# Patient Record
Sex: Female | Born: 1945 | Race: White | Hispanic: No | Marital: Married | Smoking: Former smoker
Health system: Southern US, Community
[De-identification: ages and names within clinical notes are randomized; demographics above are authoritative.]

## PROBLEM LIST (undated history)

## (undated) DIAGNOSIS — K529 Noninfective gastroenteritis and colitis, unspecified: Secondary | ICD-10-CM

## (undated) DIAGNOSIS — I10 Essential (primary) hypertension: Secondary | ICD-10-CM

## (undated) DIAGNOSIS — E079 Disorder of thyroid, unspecified: Secondary | ICD-10-CM

## (undated) DIAGNOSIS — E7800 Pure hypercholesterolemia, unspecified: Secondary | ICD-10-CM

---

## 2013-11-07 ENCOUNTER — Ambulatory Visit: Payer: Self-pay | Admitting: Physician Assistant

## 2013-11-07 LAB — BASIC METABOLIC PANEL
Anion Gap: 10 (ref 7–16)
BUN: 15 mg/dL (ref 7–18)
Calcium, Total: 9 mg/dL (ref 8.5–10.1)
Chloride: 105 mmol/L (ref 98–107)
Co2: 27 mmol/L (ref 21–32)
Creatinine: 0.82 mg/dL (ref 0.60–1.30)
EGFR (African American): >60
Glucose: 116 mg/dL — ABNORMAL HIGH (ref 65–99)
Potassium: 4.7 mmol/L (ref 3.5–5.1)
Sodium: 142 mmol/L (ref 136–145)

## 2013-11-07 LAB — CBC WITH DIFFERENTIAL/PLATELET
Basophil #: 0 10*3/uL (ref 0.0–0.1)
Eosinophil #: 0.3 10*3/uL (ref 0.0–0.7)
Eosinophil %: 2.3 %
HCT: 43.7 % (ref 35.0–47.0)
HGB: 14.4 g/dL (ref 12.0–16.0)
Lymphocyte #: 2 10*3/uL (ref 1.0–3.6)
MCH: 31 pg (ref 26.0–34.0)
MCHC: 33.1 g/dL (ref 32.0–36.0)
MCV: 94 fL (ref 80–100)
Monocyte #: 0.6 x10 3/mm (ref 0.2–0.9)
Monocyte %: 5.3 %
Platelet: 230 10*3/uL (ref 150–440)
RBC: 4.66 10*6/uL (ref 3.80–5.20)
RDW: 13.3 % (ref 11.5–14.5)
WBC: 12.3 10*3/uL — ABNORMAL HIGH (ref 3.6–11.0)

## 2013-11-07 LAB — CLOSTRIDIUM DIFFICILE(ARMC)

## 2013-11-10 LAB — STOOL CULTURE

## 2013-12-04 ENCOUNTER — Ambulatory Visit: Payer: Self-pay | Admitting: Physician Assistant

## 2013-12-11 ENCOUNTER — Ambulatory Visit: Payer: Self-pay | Admitting: Physician Assistant

## 2013-12-13 ENCOUNTER — Ambulatory Visit: Payer: Self-pay | Admitting: Physician Assistant

## 2013-12-15 ENCOUNTER — Ambulatory Visit: Payer: Self-pay | Admitting: Family Medicine

## 2017-10-08 ENCOUNTER — Ambulatory Visit: Payer: Medicare Other

## 2017-10-08 ENCOUNTER — Other Ambulatory Visit: Payer: Self-pay

## 2017-10-08 ENCOUNTER — Encounter: Payer: Self-pay | Admitting: *Deleted

## 2017-10-08 ENCOUNTER — Ambulatory Visit
Admission: EM | Admit: 2017-10-08 | Discharge: 2017-10-08 | Disposition: A | Discharge status: Home / Self Care | Payer: Medicare Other | Attending: Family Medicine | Admitting: Family Medicine

## 2017-10-08 DIAGNOSIS — W19XXXA Unspecified fall, initial encounter: Secondary | ICD-10-CM

## 2017-10-08 DIAGNOSIS — S62657A Nondisplaced fracture of medial phalanx of left little finger, initial encounter for closed fracture: Secondary | ICD-10-CM

## 2017-10-08 DIAGNOSIS — M79645 Pain in left finger(s): Secondary | ICD-10-CM | POA: Diagnosis present

## 2017-10-08 HISTORY — DX: Disorder of thyroid, unspecified: E07.9

## 2017-10-08 NOTE — Discharge Instructions (Signed)
Ice, Rest and elevate. Wear finger splint and buddy tape.   Follow up with your primary care physician or orthopedic in one week. Return to Urgent care for new or worsening concerns.

## 2017-10-08 NOTE — ED Triage Notes (Signed)
Patient fell up a set of stairs yesterday PM injuring her left pinky finger. No previus history of left hand injuries.

## 2017-10-08 NOTE — ED Provider Notes (Signed)
[facility] ____________________________________________  Time seen: Approximately 2:06 PM  I have reviewed the triage vital signs and the nursing notes.   HISTORY  Chief Complaint Finger Injury   HPI Lisa Pope is a 71 y.o. female presented for evaluation of the left fifth finger pain after injury that occurred yesterday.  Patient reports that she is walking up steps, tripped on the step causing her to fall forward.  States in the process of catching herself she hit her left fifth digit on the railing.  States pain only to the area.  Denies head injury, loss consciousness, other pain or injuries.  States pain is currently mild, worse with direct palpation.  States wanted to evaluate make sure no fracture.  Denies other aggravating or alleviating factors.  Reports otherwise feels well and denies other complaints.    Past Medical History:  Diagnosis Date  . Thyroid disease     There are no active problems to display for this patient.   History reviewed. No pertinent surgical history.   No current facility-administered medications for this encounter.   Current Outpatient Medications:  .  levothyroxine (SYNTHROID, LEVOTHROID) 88 MCG tablet, Take 88 mcg daily before breakfast by mouth., Disp: , Rfl:  .  Multiple Vitamin (MULTIVITAMIN) tablet, Take 1 tablet daily by mouth., Disp: , Rfl:  .  Omega-3 Fatty Acids (FISH OIL PO), Take daily by mouth., Disp: , Rfl:   Allergies Patient has no known allergies.  Family History  Problem Relation Age of Onset  . Cancer Mother   . Heart attack Father     Social History Social History   Tobacco Use  . Smoking status: Former Games developermoker  . Smokeless tobacco: Never Used  Substance Use Topics  . Alcohol use: Yes  . Drug use: No    Review of Systems Constitutional: No fever/chills Cardiovascular: Denies chest pain. Respiratory: Denies shortness of breath. Gastrointestinal: No abdominal pain.   Musculoskeletal:  Negative for back pain.  ____________________________________________   PHYSICAL EXAM:  VITAL SIGNS: ED Triage Vitals  Enc Vitals Group     BP 10/08/17 1340 (!) 160/59     Pulse Rate 10/08/17 1340 66     Resp 10/08/17 1340 16     Temp 10/08/17 1340 98.1 F (36.7 C)     Temp Source 10/08/17 1340 Oral     SpO2 10/08/17 1340 99 %     Weight 10/08/17 1341 129 lb (58.5 kg)     Height 10/08/17 1341 5' (1.524 m)     Head Circumference --      Peak Flow --      Pain Score 10/08/17 1344 5     Pain Loc --      Pain Edu? --      Excl. in GC? --     Constitutional: Alert and oriented. Well appearing and in no acute distress.. Cardiovascular: Normal rate, regular rhythm. Grossly normal heart sounds.  Good peripheral circulation. Respiratory: Normal respiratory effort without tachypnea nor retractions. Breath sounds are clear and equal bilaterally. No wheezes, rales, rhonchi. Musculoskeletal:  Steady gait.  Except: Left hand fifth digit at PIP joint moderate tenderness to direct palpation, mild ecchymosis and swelling present to middle proximal phalanx of fifth digit as well as MCP joint, normal distal sensation and capillary refill, no motor or tendon deficit to the fifth digit, left hand otherwise nontender. Neurologic:  Normal speech and language. Speech is normal. No gait instability.  Skin:  Skin is warm, dry and  intact. No rash noted. Psychiatric: Mood and affect are normal. Speech and behavior are normal. Patient exhibits appropriate insight and judgment   ___________________________________________   LABS (all labs ordered are listed, but only abnormal results are displayed)  Labs Reviewed - No data to display ____________________________________________  RADIOLOGY  Dg Finger Little Left  Result Date: 10/08/2017 CLINICAL DATA:  Proximal interphalangeal joint pain after fall EXAM: LEFT LITTLE FINGER 2+V COMPARISON:  None. FINDINGS: There are oblique intra-articular fractures  at the dorsal and ventral surfaces of the base of the middle phalanx of the left little finger. Moderate soft tissue swelling. IMPRESSION: Oblique intra-articular fractures at the dorsal and ventral surfaces of the left little finger middle phalangeal base. Electronically Signed   By: Deatra RobinsonKevin  Herman M.D.   On: 10/08/2017 14:25   ____________________________________________   PROCEDURES Procedures    INITIAL IMPRESSION / ASSESSMENT AND PLAN / ED COURSE  Pertinent labs & imaging results that were available during my care of the patient were reviewed by me and considered in my medical decision making (see chart for details).  Well-appearing patient.  No acute distress.  Left fifth digit pain and swelling post mechanical injury that occurred yesterday.  Denies other injuries.  Left fifth digit x-ray per radiologist oblique intra-articular fracture of the dorsal and ventral surfaces of the left fifth digit middle proximal phalanx base.  With digit placed in finger splint, fourth and fifth digit buddy taped.  Counseled regarding ice, elevation and supportive care.  Keep in splint.  Follow-up with primary orthopedic in 1 week, information given.  Discussed follow up and return parameters including no resolution or any worsening concerns. Patient verbalized understanding and agreed to plan.   ____________________________________________   FINAL CLINICAL IMPRESSION(S) / ED DIAGNOSES  Final diagnoses:  Nondisplaced fracture of middle phalanx of left little finger, initial encounter for closed fracture     ED Discharge Orders    None       Note: This dictation was prepared with Dragon dictation along with smaller phrase technology. Any transcriptional errors that result from this process are unintentional.         Renford DillsMiller, Lenoard Helbert, NP 10/08/17 [phone]

## 2018-07-01 ENCOUNTER — Ambulatory Visit
Admission: EM | Admit: 2018-07-01 | Discharge: 2018-07-01 | Disposition: A | Discharge status: Home / Self Care | Payer: Medicare Other | Attending: Family Medicine | Admitting: Family Medicine

## 2018-07-01 ENCOUNTER — Encounter: Payer: Self-pay | Admitting: Emergency Medicine

## 2018-07-01 ENCOUNTER — Other Ambulatory Visit: Payer: Self-pay

## 2018-07-01 ENCOUNTER — Ambulatory Visit: Payer: Medicare Other

## 2018-07-01 DIAGNOSIS — W19XXXA Unspecified fall, initial encounter: Secondary | ICD-10-CM | POA: Insufficient documentation

## 2018-07-01 DIAGNOSIS — S6992XA Unspecified injury of left wrist, hand and finger(s), initial encounter: Secondary | ICD-10-CM | POA: Diagnosis present

## 2018-07-01 DIAGNOSIS — S62317A Displaced fracture of base of fifth metacarpal bone. left hand, initial encounter for closed fracture: Secondary | ICD-10-CM | POA: Insufficient documentation

## 2018-07-01 DIAGNOSIS — Y92009 Unspecified place in unspecified non-institutional (private) residence as the place of occurrence of the external cause: Secondary | ICD-10-CM | POA: Insufficient documentation

## 2018-07-01 DIAGNOSIS — E079 Disorder of thyroid, unspecified: Secondary | ICD-10-CM | POA: Diagnosis not present

## 2018-07-01 DIAGNOSIS — Z79890 Hormone replacement therapy (postmenopausal): Secondary | ICD-10-CM | POA: Diagnosis not present

## 2018-07-01 DIAGNOSIS — Z79899 Other long term (current) drug therapy: Secondary | ICD-10-CM | POA: Diagnosis not present

## 2018-07-01 DIAGNOSIS — W108XXA Fall (on) (from) other stairs and steps, initial encounter: Secondary | ICD-10-CM | POA: Diagnosis not present

## 2018-07-01 DIAGNOSIS — Z87891 Personal history of nicotine dependence: Secondary | ICD-10-CM | POA: Insufficient documentation

## 2018-07-01 NOTE — Discharge Instructions (Signed)
Follow up with hand orthopedic specialist this week

## 2018-07-01 NOTE — ED Provider Notes (Signed)
[facility]    CSN: [id number] Arrival date & time: 07/01/18  0850     History   Chief Complaint Chief Complaint  Patient presents with  . Hand Injury    HPI Lisa Pope is a 72 y.o. female.   72 yo female with a c/o left hand pain since injuring it 6 days ago. States she fell at home and since then has had pain and swelling to the hand. Has been icing it.   The history is provided by the patient.  Hand Injury    Past Medical History:  Diagnosis Date  . Thyroid disease     There are no active problems to display for this patient.   History reviewed. No pertinent surgical history.  OB History   None      Home Medications    Prior to Admission medications   Medication Sig Start Date End Date Taking? Authorizing Provider  levothyroxine (SYNTHROID, LEVOTHROID) 88 MCG tablet Take 88 mcg daily before breakfast by mouth.   Yes [provider]  Multiple Vitamin (MULTIVITAMIN) tablet Take 1 tablet daily by mouth.   Yes [provider]  Omega-3 Fatty Acids (FISH OIL PO) Take daily by mouth.   Yes [provider]    Family History Family History  Problem Relation Age of Onset  . Cancer Mother   . Heart attack Father     Social History Social History   Tobacco Use  . Smoking status: Former Games developermoker  . Smokeless tobacco: Never Used  Substance Use Topics  . Alcohol use: Yes  . Drug use: No     Allergies   Patient has no known allergies.   Review of Systems Review of Systems   Physical Exam Triage Vital Signs ED Triage Vitals  Enc Vitals Group     BP 07/01/18 0905 135/64     Pulse Rate 07/01/18 0905 65     Resp 07/01/18 0905 16     Temp 07/01/18 0905 98.2 F (36.8 C)     Temp Source 07/01/18 0905 Oral     SpO2 07/01/18 0905 98 %     Weight 07/01/18 0903 124 lb (56.2 kg)     Height 07/01/18 0903 5' (1.524 m)     Head Circumference --      Peak Flow --      Pain Score 07/01/18 0903 7     Pain Loc --       Pain Edu? --      Excl. in GC? --    No data found.  Updated Vital Signs BP 135/64 (BP Location: Right Arm)   Pulse 65   Temp 98.2 F (36.8 C) (Oral)   Resp 16   Ht 5' (1.524 m)   Wt 124 lb (56.2 kg)   SpO2 98%   BMI 24.22 kg/m   Visual Acuity Right Eye Distance:   Left Eye Distance:   Bilateral Distance:    Right Eye Near:   Left Eye Near:    Bilateral Near:     Physical Exam  Constitutional: She appears well-developed and well-nourished. No distress.  Musculoskeletal:       Left hand: She exhibits decreased range of motion, tenderness, bony tenderness and swelling. She exhibits normal two-point discrimination, normal capillary refill, no deformity and no laceration. Normal sensation noted. Normal strength noted.  Left hand neurovascularly intact  Skin: She is not diaphoretic.  Nursing note and vitals reviewed.    UC Treatments /  Results  Labs (all labs ordered are listed, but only abnormal results are displayed) Labs Reviewed - No data to display  EKG None  Radiology Dg Hand Complete Left  Result Date: 07/01/2018 CLINICAL DATA:  Fall walking up stairs 5 days ago with left hand injury. Initial encounter. EXAM: LEFT HAND - COMPLETE 3+ VIEW COMPARISON:  Fifth digit radiograph 10/08/2017 FINDINGS: Fracture lucency with mild displacement at the base of the fifth metacarpal. This is new from prior but age indeterminate given sclerotic lines. Diffuse interphalangeal osteoarthritis with spurring greatest at the fifth PIP joint. No dislocation. IMPRESSION: 1. Fifth metacarpal base fracture that is new from November 2018 but age indeterminate. Displacement is mild. 2. Advanced interphalangeal osteoarthritis. Electronically Signed   By: Marnee Spring M.D.   On: 07/01/2018 09:42    Procedures Procedures (including critical care time)  Medications Ordered in UC Medications - No data to display  Initial Impression / Assessment and Plan / UC Course  I have reviewed  the triage vital signs and the nursing notes.  Pertinent labs & imaging results that were available during my care of the patient were reviewed by me and considered in my medical decision making (see chart for details).      Final Clinical Impressions(s) / UC Diagnoses   Final diagnoses:  Closed displaced fracture of base of fifth metacarpal bone of left hand, initial encounter    ED Prescriptions    None      1. x-ray results and diagnosis reviewed with patient 2. Immobilized left hand with ulnar gutter splint and sling 3. Recommend supportive treatment with otc analgesics prn  4. Follow-up with orthopedist hand specialist this week     Controlled Substance Prescriptions [place] Controlled Substance Registry consulted? Not Applicable   Payton Mccallum, MD 07/01/18 [phone]

## 2018-07-01 NOTE — ED Triage Notes (Signed)
Patient c/o falling up the stairs Friday night and injuring her left hand.

## 2019-01-21 IMAGING — CR DG FINGER LITTLE 2+V*L*
3 series · 3 of 3 positions shown · non-contrast
Comparison: None.

CLINICAL DATA: Proximal interphalangeal joint pain after fall

EXAM:
LEFT LITTLE FINGER 2+V

[finger ap]
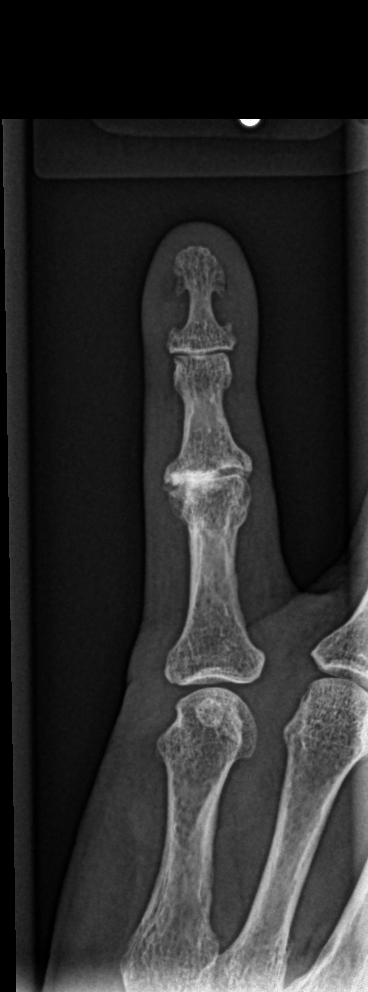

[finger obl]
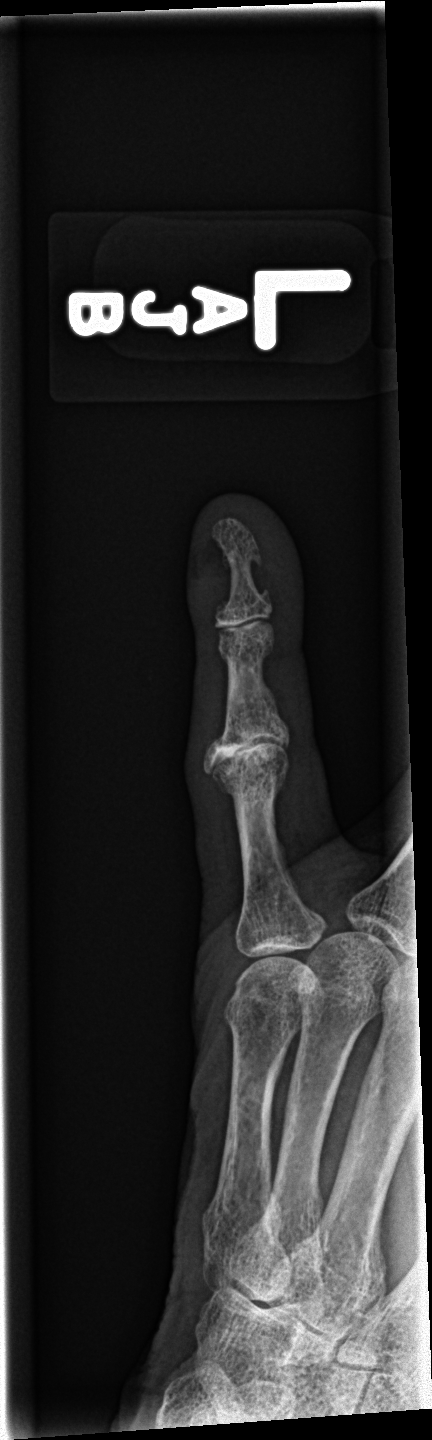

[finger lat]
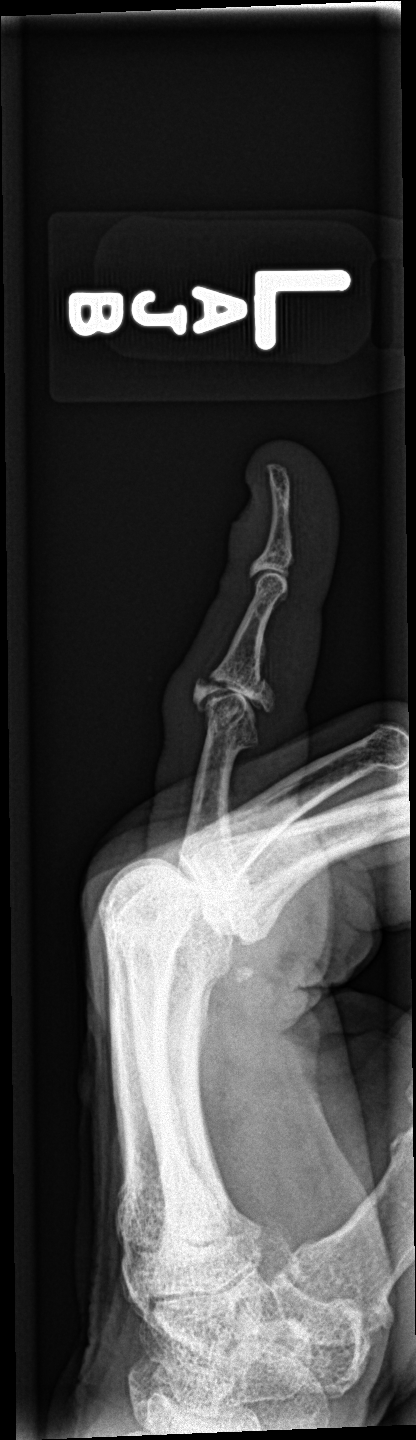

[3 of 3 positions shown; findings below may reference images not displayed]

FINDINGS: There are oblique intra-articular fractures at the dorsal and
ventral surfaces of the base of the middle phalanx of the left
little finger. Moderate soft tissue swelling.
IMPRESSION: Oblique intra-articular fractures at the dorsal and ventral surfaces
of the left little finger middle phalangeal base.

## 2020-08-14 ENCOUNTER — Other Ambulatory Visit: Payer: Self-pay

## 2020-08-14 ENCOUNTER — Ambulatory Visit: Payer: Medicare Other | Admitting: Dermatology

## 2020-08-14 DIAGNOSIS — L80 Vitiligo: Secondary | ICD-10-CM

## 2020-08-14 DIAGNOSIS — B360 Pityriasis versicolor: Secondary | ICD-10-CM

## 2020-08-14 DIAGNOSIS — L820 Inflamed seborrheic keratosis: Secondary | ICD-10-CM | POA: Diagnosis not present

## 2020-08-14 MED ORDER — KETOCONAZOLE 2 % EX CREA: TOPICAL_CREAM | CUTANEOUS | 0 refills | Status: DC

## 2020-08-14 MED ORDER — TACROLIMUS 0.1 % EX OINT: TOPICAL_OINTMENT | CUTANEOUS | 2 refills | Status: DC

## 2020-08-14 NOTE — Patient Instructions (Addendum)
Cryotherapy Aftercare  . Wash gently with soap and water everyday.   Marland Kitchen Apply Vaseline and Band-Aid daily until healed.   Ketoconazole 2% cream - Apply to light spots on chest and neck 1-2 times a day for Tinea Versicolor until improved.  Tacrolimus Ointment - Apply to vitiligo spots 1-2 times daily, ok to use on face.

## 2020-08-14 NOTE — Progress Notes (Signed)
   New Patient Visit  Subjective  Lisa Pope is a 74 y.o. female who presents for the following: Light spots (hands, face). She has had this condition for many years. She has used topicals in the past on her hands which didn't help. New area of lightened skin around her mouth She also has a growth on her neck that is bothersome.   The following portions of the chart were reviewed this encounter and updated as appropriate:      Review of Systems:  No other skin or systemic complaints except as noted in HPI or Assessment and Plan.  Objective  Well appearing patient in no apparent distress; mood and affect are within normal limits.  A focused examination was performed including face, hands. Relevant physical exam findings are noted in the Assessment and Plan.  Objective  Perioral; bil medial canthus; dorsum hands: Depigmented macules and patches on hands, R>L perioral area, medial canthus BL.  Images        Objective  Chest, Neck: Hypopigmented macules and patches.  Objective  Right Clavicle x 1: Erythematous keratotic or waxy stuck-on papule   Assessment & Plan  Vitiligo Perioral; bil medial canthus; dorsum hands  Start tacrolimus 0.1% ointment Apply to AAs qd/bid dsp 60g 2Rf.  Recommend daily broad spectrum sunscreen SPF 30+ to sun-exposed areas, reapply every 2 hours as needed.   Discussed Xtrac laser, but distance is a factor.  Discussed topical treatment not as effective for hands  tacrolimus (PROTOPIC) 0.1 % ointment - Perioral; bil medial canthus; dorsum hands  Tinea versicolor Chest, Neck  Start ketoconazole 2% cream Apply to light spots on chest and neck BID until improved dsp 60g 0Rf.  May recur Rec Zbar in shower during summer as preventative  ketoconazole (NIZORAL) 2 % cream - Chest, Neck  Inflamed seborrheic keratosis Right Clavicle x 1  Destruction of lesion - Right Clavicle x 1  Destruction method: cryotherapy   Informed consent:  discussed and consent obtained   Lesion destroyed using liquid nitrogen: Yes   Region frozen until ice ball extended beyond lesion: Yes   Outcome: patient tolerated procedure well with no complications   Post-procedure details: wound care instructions given    Return if symptoms worsen or fail to improve.   ICherlyn Labella, CMA, am acting as scribe for Willeen Niece, MD .  Documentation: I have reviewed the above documentation for accuracy and completeness, and I agree with the above.  Willeen Niece MD

## 2020-09-13 ENCOUNTER — Other Ambulatory Visit: Payer: Self-pay | Admitting: Dermatology

## 2020-09-13 DIAGNOSIS — B360 Pityriasis versicolor: Secondary | ICD-10-CM

## 2021-01-01 ENCOUNTER — Ambulatory Visit: Payer: Medicare Other | Admitting: Dermatology

## 2021-01-01 ENCOUNTER — Other Ambulatory Visit: Payer: Self-pay

## 2021-01-01 DIAGNOSIS — L80 Vitiligo: Secondary | ICD-10-CM

## 2021-01-01 DIAGNOSIS — B360 Pityriasis versicolor: Secondary | ICD-10-CM

## 2021-01-01 MED ORDER — KETOCONAZOLE 2 % EX CREA: TOPICAL_CREAM | Freq: Every day | CUTANEOUS | 2 refills | Status: AC

## 2021-01-01 MED ORDER — TACROLIMUS 0.1 % EX OINT: TOPICAL_OINTMENT | CUTANEOUS | 2 refills | Status: DC

## 2021-01-01 NOTE — Progress Notes (Signed)
   Follow-Up Visit   Subjective  Lisa Pope is a 75 y.o. female who presents for the following: Vitiligo (Face, Tacrolimus qd) and Tinea versicolor (Chest, neck, ketoconazole 2% cr qd).  Both conditions seem to be better.   The following portions of the chart were reviewed this encounter and updated as appropriate:       Review of Systems:  No other skin or systemic complaints except as noted in HPI or Assessment and Plan.  Objective  Well appearing patient in no apparent distress; mood and affect are within normal limits.  A focused examination was performed including face, neck, chest. Relevant physical exam findings are noted in the Assessment and Plan.  Objective  perioral, bil medial canthus, hands: Repigmented patches perioral/periocular area when compared to photos, hypopigmented patches on hands  Objective  Neck, chest: Clear today   Assessment & Plan  Vitiligo perioral, bil medial canthus, hands  Face Improved compared to photos, pt not currently treating hands- no change  Cont Tacrolimus 0.1% oint qd to aas face Discussed Opzelura cream- may try in future once approved for vitiligo.  Reviewed chronic nature, no cure and can be difficult to treat.  Vitiligo is an autoimmune condition which causes loss of skin pigment and is commonly seen on the face and may also involve areas of trauma like hands, elbows, knees, and ankles.  Treatments include topical steroids and other topical anti-inflammatory ointments/creams.  Sometimes narrow band UV light therapy or Xtrac laser is helpful, both of which require twice weekly treatments for at least 3-6 months.  Antioxidant vitamins, such as Vitamins C&E, and alpha lipoic acid may be added to enhance treatment.   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  As preventative, Cont Ketoconazole 2% cr qd to  aas Start Vanicream Z bar in shower, sample given   Reordered Medications ketoconazole (NIZORAL) 2 % cream  Return in about 6 months (around 07/01/2021) for vitiligo, TV.   I, Sonya Hupman, RMA, am acting as scribe for Willeen Niece, MD . Documentation: I have reviewed the above documentation for accuracy and completeness, and I agree with the above.  Willeen Niece MD

## 2021-07-03 ENCOUNTER — Ambulatory Visit: Payer: Medicare Other | Admitting: Dermatology

## 2021-09-08 ENCOUNTER — Ambulatory Visit: Payer: Medicare Other

## 2021-12-18 ENCOUNTER — Other Ambulatory Visit: Payer: Self-pay

## 2021-12-18 ENCOUNTER — Ambulatory Visit: Payer: Medicare Other | Admitting: Dermatology

## 2021-12-18 DIAGNOSIS — L80 Vitiligo: Secondary | ICD-10-CM | POA: Diagnosis not present

## 2021-12-18 MED ORDER — OPZELURA 1.5 % EX CREA: 1.0000 "application " | TOPICAL_CREAM | Freq: Two times a day (BID) | CUTANEOUS | 1 refills | Status: AC

## 2021-12-18 MED ORDER — OPZELURA 1.5 % EX CREA: 1.0000 "application " | TOPICAL_CREAM | Freq: Two times a day (BID) | CUTANEOUS | 1 refills | Status: DC

## 2021-12-18 NOTE — Progress Notes (Addendum)
° °  Follow-Up Visit   Subjective  Lisa Pope is a 76 y.o. female who presents for the following: Vitiligo (Perioral, medial canthus, hands. Patient has treated with tacrolimus in the past, without much improvement. She would like to try a different treatment for areas on face, not concerned about treating hands.).  Tacrolimus ointment was very expensive.   The following portions of the chart were reviewed this encounter and updated as appropriate:       Review of Systems:  No other skin or systemic complaints except as noted in HPI or Assessment and Plan.  Objective  Well appearing patient in no apparent distress; mood and affect are within normal limits.  A focused examination was performed including face, hands. Relevant physical exam findings are noted in the Assessment and Plan.  face Mild hypopigmentation perioral, depigmented macules bilateral medial canthus  Photos compared from 08/14/2021. No changes    Assessment & Plan  Vitiligo face  Chronic condition, stable, but not at goal  Start Opzelura Cream Apply to AA face BID dsp 60g 1Rf. Lot [id number] Exp 04/2023    Reviewed chronic nature, no cure and can be difficult to treat.  Vitiligo is an autoimmune condition which causes loss of skin pigment and is commonly seen on the face and may also involve areas of trauma like hands, elbows, knees, and ankles.  Treatments include topical steroids and other topical anti-inflammatory ointments/creams and topical and oral Jak inhibitors.  Sometimes narrow band UV light therapy or Xtrac laser is helpful, both of which require twice weekly treatments for at least 3-6 months.  Antioxidant vitamins, such as Vitamins A,C,E,D, Folic Acid and B12 may be added to enhance treatment.  Related Medications Ruxolitinib Phosphate (OPZELURA) 1.5 % CREA Apply 1 application topically in the morning and at bedtime.   Return in about 6 months (around 06/17/2022) for vitiligo.  ICherlyn Labella, CMA, am acting as scribe for Willeen Niece, MD . Documentation: I have reviewed the above documentation for accuracy and completeness, and I agree with the above.  Willeen Niece MD

## 2021-12-18 NOTE — Patient Instructions (Addendum)
Start Opzelura Cream Apply to vitiligo spots on face twice daily.  Reviewed chronic nature, no cure and can be difficult to treat.  Vitiligo is an autoimmune condition which causes loss of skin pigment and is commonly seen on the face and may also involve areas of trauma like hands, elbows, knees, and ankles.  Treatments include topical steroids and other topical anti-inflammatory ointments/creams and topical and oral Jak inhibitors.  Sometimes narrow band UV light therapy or Xtrac laser is helpful, both of which require twice weekly treatments for at least 3-6 months.  Antioxidant vitamins, such as Vitamins A,C,E,D, Folic Acid and B12 may be added to enhance treatment.    If You Need Anything After Your Visit  If you have any questions or concerns for your doctor, please call our main line at [phone] and press option 4 to reach your doctor's medical assistant. If no one answers, please leave a voicemail as directed and we will return your call as soon as possible. Messages left after 4 pm will be answered the following business day.   You may also send [place] a message via MyChart. We typically respond to MyChart messages within 1-2 business days.  For prescription refills, please ask your pharmacy to contact our office. Our fax number is [phone].  If you have an urgent issue when the clinic is closed that cannot wait until the next business day, you can page your doctor at the number below.    Please note that while we do our best to be available for urgent issues outside of office hours, we are not available 24/7.   If you have an urgent issue and are unable to reach [place], you may choose to seek medical care at your doctor's office, retail clinic, urgent care center, or emergency room.  If you have a medical emergency, please immediately call 911 or go to the emergency department.  Pager Numbers  - Dr. Gwen Pounds: [phone]  - Dr. Neale Burly: [phone]  - Dr. Roseanne Reno:  [phone]  In the event of inclement weather, please call our main line at [phone] for an update on the status of any delays or closures.  Dermatology Medication Tips: Please keep the boxes that topical medications come in in order to help keep track of the instructions about where and how to use these. Pharmacies typically print the medication instructions only on the boxes and not directly on the medication tubes.   If your medication is too expensive, please contact our office at [phone] option 4 or send [place] a message through MyChart.   We are unable to tell what your co-pay for medications will be in advance as this is different depending on your insurance coverage. However, we may be able to find a substitute medication at lower cost or fill out paperwork to get insurance to cover a needed medication.   If a prior authorization is required to get your medication covered by your insurance company, please allow [place] 1-2 business days to complete this process.  Drug prices often vary depending on where the prescription is filled and some pharmacies may offer cheaper prices.  The website www.goodrx.com contains coupons for medications through different pharmacies. The prices here do not account for what the cost may be with help from insurance (it may be cheaper with your insurance), but the website can give you the price if you did not use any insurance.  - You can print the associated coupon and take it with your prescription to the pharmacy.  -  You may also stop by our office during regular business hours and pick up a GoodRx coupon card.  - If you need your prescription sent electronically to a different pharmacy, notify our office through [facility] or by phone at [phone] option 4.     Si Usted Necesita Algo Despus de Su Visita  Tambin puede enviarnos un mensaje a travs de Clinical cytogeneticist. Por lo general respondemos a los mensajes de MyChart en el transcurso de 1 a 2  das hbiles.  Para renovar recetas, por favor pida a su farmacia que se ponga en contacto con nuestra oficina. Annie Sable de fax es [place] [phone].  Si tiene un asunto urgente cuando la clnica est cerrada y que no puede esperar hasta el siguiente da hbil, puede llamar/localizar a su doctor(a) al nmero que aparece a continuacin.   Por favor, tenga en cuenta que aunque hacemos todo lo posible para estar disponibles para asuntos urgentes fuera del horario de [place], no estamos disponibles las 24 horas del da, los 7 [address]  [address] de la [place].   Si tiene un problema urgente y no puede comunicarse con nosotros, puede optar por buscar atencin mdica  en el consultorio de su doctor(a), en una clnica privada, en un centro de atencin urgente o en una sala de emergencias.  Si tiene Engineer, drilling, por favor llame inmediatamente al 911 o vaya a la sala de emergencias.  Nmeros de bper  - Dr. Gwen Pounds: [phone]  - Dra. Moye: [phone]  - Dra. Roseanne Reno: [phone]  En caso de inclemencias del [place], por favor llame a Lacy Duverney principal al [phone] para una actualizacin sobre el [place] de cualquier retraso o cierre.  Consejos para la medicacin en dermatologa: Por favor, guarde las cajas en las que vienen los medicamentos de uso tpico para ayudarle a seguir las instrucciones sobre dnde y cmo usarlos. Las farmacias generalmente imprimen las instrucciones del medicamento slo en las cajas y no directamente en los tubos del [place].   Si su medicamento es muy caro, por favor, pngase en contacto con Rolm Gala llamando al [phone] y presione la opcin 4 o envenos un mensaje a travs de Clinical cytogeneticist.   No podemos decirle cul ser su copago por los medicamentos por adelantado ya que esto es diferente dependiendo de la cobertura de su seguro. Sin embargo, es posible que podamos encontrar un medicamento sustituto a Audiological scientist un formulario para que el  seguro cubra el medicamento que se considera necesario.   Si se requiere una autorizacin previa para que su compaa de seguros [place] su medicamento, por favor permtanos de 1 a 2 das hbiles para completar [address]  Los precios de los medicamentos varan con frecuencia dependiendo del Environmental consultant de dnde se surte la receta y alguna farmacias pueden ofrecer precios ms baratos.  El sitio web www.goodrx.com tiene cupones para medicamentos de Health and safety inspector. Los precios aqu no tienen en cuenta lo que podra costar con la ayuda del seguro (puede ser ms barato con su seguro), pero el sitio web puede darle el precio si no utiliz Tourist information centre manager.  - Puede imprimir el cupn correspondiente y llevarlo con su receta a la farmacia.  - Tambin puede pasar por nuestra oficina durante el horario de atencin regular y Education officer, museum una tarjeta de cupones de GoodRx.  - Si necesita que su receta se enve electrnicamente a Psychiatrist, informe a nuestra oficina a travs de MyChart de [place] o por telfono llamando al [phone] y  presione la opcin 4.

## 2021-12-20 ENCOUNTER — Telehealth: Payer: Self-pay

## 2021-12-20 NOTE — Telephone Encounter (Signed)
Opzelura PA denied.

## 2021-12-24 NOTE — Telephone Encounter (Signed)
Left VM for patient to return my call

## 2021-12-24 NOTE — Telephone Encounter (Signed)
Because of her age, even if the appeal was approved, it would still be a unaffordable copay for the Opzelura.

## 2021-12-25 NOTE — Telephone Encounter (Signed)
Patient advised of information per Dr. Stewart. 

## 2022-06-25 ENCOUNTER — Ambulatory Visit: Payer: Medicare Other | Admitting: Dermatology

## 2022-07-10 ENCOUNTER — Ambulatory Visit: Payer: Medicare Other | Admitting: Dermatology

## 2022-07-10 ENCOUNTER — Encounter: Payer: Self-pay | Admitting: Dermatology

## 2022-07-10 DIAGNOSIS — L80 Vitiligo: Secondary | ICD-10-CM | POA: Diagnosis not present

## 2022-07-10 DIAGNOSIS — L821 Other seborrheic keratosis: Secondary | ICD-10-CM | POA: Diagnosis not present

## 2022-07-10 MED ORDER — PIMECROLIMUS 1 % EX CREA: TOPICAL_CREAM | Freq: Two times a day (BID) | CUTANEOUS | 0 refills | Status: AC

## 2022-07-10 NOTE — Patient Instructions (Addendum)
Ketoconazole does not help with vitiligo.   Opzelura samples (x2) given today to be applied twice daily to affected areas Once out of Opzelura start Pimecrolimus.   Start Pimecrolimus cream once or twice daily to affected ares on face  Recommend starting moisturizer with exfoliant (Urea, Salicylic acid, or Lactic acid) one to two times daily to help smooth rough and bumpy skin.  OTC options include Cetaphil Rough and Bumpy lotion (Urea), Eucerin Roughness Relief lotion or spot treatment cream (Urea), CeraVe SA lotion/cream for Rough and Bumpy skin (Sal Acid), Gold Bond Rough and Bumpy cream (Sal Acid), and AmLactin 12% lotion/cream (Lactic Acid).  If applying in morning, also apply sunscreen to sun-exposed areas, since these exfoliating moisturizers can increase sensitivity to sun.   Seborrheic Keratosis  What causes seborrheic keratoses? Seborrheic keratoses are harmless, common skin growths that first appear during adult life.  As time goes by, more growths appear.  Some people may develop a large number of them.  Seborrheic keratoses appear on both covered and uncovered body parts.  They are not caused by sunlight.  The tendency to develop seborrheic keratoses can be inherited.  They vary in color from skin-colored to gray, brown, or even black.  They can be either smooth or have a rough, warty surface.   Seborrheic keratoses are superficial and look as if they were stuck on the skin.  Under the microscope this type of keratosis looks like layers upon layers of skin.  That is why at times the top layer may seem to fall off, but the rest of the growth remains and re-grows.    Treatment Seborrheic keratoses do not need to be treated, but can easily be removed in the office.  Seborrheic keratoses often cause symptoms when they rub on clothing or jewelry.  Lesions can be in the way of shaving.  If they become inflamed, they can cause itching, soreness, or burning.  Removal of a seborrheic keratosis  can be accomplished by freezing, burning, or surgery. If any spot bleeds, scabs, or grows rapidly, please return to have it checked, as these can be an indication of a skin cancer.   Due to recent changes in healthcare laws, you may see results of your pathology and/or laboratory studies on MyChart before the doctors have had a chance to review them. We understand that in some cases there may be results that are confusing or concerning to you. Please understand that not all results are received at the same time and often the doctors may need to interpret multiple results in order to provide you with the best plan of care or course of treatment. Therefore, we ask that you please give [place] 2 business days to thoroughly review all your results before contacting the office for clarification. Should we see a critical lab result, you will be contacted sooner.   If You Need Anything After Your Visit  If you have any questions or concerns for your doctor, please call our main line at [phone] and press option 4 to reach your doctor's medical assistant. If no one answers, please leave a voicemail as directed and we will return your call as soon as possible. Messages left after 4 pm will be answered the following business day.   You may also send [place] a message via MyChart. We typically respond to MyChart messages within 1-2 business days.  For prescription refills, please ask your pharmacy to contact our office. Our fax number is [phone].  If you have an  urgent issue when the clinic is closed that cannot wait until the next business day, you can page your doctor at the number below.    Please note that while we do our best to be available for urgent issues outside of office hours, we are not available 24/7.   If you have an urgent issue and are unable to reach [place], you may choose to seek medical care at your doctor's office, retail clinic, urgent care center, or emergency room.  If you have a medical  emergency, please immediately call 911 or go to the emergency department.  Pager Numbers  - Dr. Gwen Pounds: [phone]  - Dr. Neale Burly: [phone]  - Dr. Roseanne Reno: [phone]  In the event of inclement weather, please call our main line at [phone] for an update on the status of any delays or closures.  Dermatology Medication Tips: Please keep the boxes that topical medications come in in order to help keep track of the instructions about where and how to use these. Pharmacies typically print the medication instructions only on the boxes and not directly on the medication tubes.   If your medication is too expensive, please contact our office at [phone] option 4 or send [place] a message through MyChart.   We are unable to tell what your co-pay for medications will be in advance as this is different depending on your insurance coverage. However, we may be able to find a substitute medication at lower cost or fill out paperwork to get insurance to cover a needed medication.   If a prior authorization is required to get your medication covered by your insurance company, please allow [place] 1-2 business days to complete this process.  Drug prices often vary depending on where the prescription is filled and some pharmacies may offer cheaper prices.  The website www.goodrx.com contains coupons for medications through different pharmacies. The prices here do not account for what the cost may be with help from insurance (it may be cheaper with your insurance), but the website can give you the price if you did not use any insurance.  - You can print the associated coupon and take it with your prescription to the pharmacy.  - You may also stop by our office during regular business hours and pick up a GoodRx coupon card.  - If you need your prescription sent electronically to a different pharmacy, notify our office through [facility] or by phone at [phone] option 4.     Si Usted  Necesita Algo Despus de Su Visita  Tambin puede enviarnos un mensaje a travs de Clinical cytogeneticist. Por lo general respondemos a los mensajes de MyChart en el transcurso de 1 a 2 das hbiles.  Para renovar recetas, por favor pida a su farmacia que se ponga en contacto con nuestra oficina. Annie Sable de fax es [place] [phone].  Si tiene un asunto urgente cuando la clnica est cerrada y que no puede esperar hasta el siguiente da hbil, puede llamar/localizar a su doctor(a) al nmero que aparece a continuacin.   Por favor, tenga en cuenta que aunque hacemos todo lo posible para estar disponibles para asuntos urgentes fuera del horario de [place], no estamos disponibles las 24 horas del da, los 7 [address]  [address] de la [place].   Si tiene un problema urgente y no puede comunicarse con nosotros, puede optar por buscar atencin mdica  en el consultorio de su doctor(a), en una clnica privada, en un centro de atencin urgente o en una sala de emergencias.  Si tiene Engineer, drilling, por favor llame inmediatamente al 911 o vaya a la sala de emergencias.  Nmeros de bper  - Dr. Gwen Pounds: [phone]  - Dra. Moye: [phone]  - Dra. Roseanne Reno: [phone]  En caso de inclemencias del [place], por favor llame a Lacy Duverney principal al [phone] para una actualizacin sobre el [place] de cualquier retraso o cierre.  Consejos para la medicacin en dermatologa: Por favor, guarde las cajas en las que vienen los medicamentos de uso tpico para ayudarle a seguir las instrucciones sobre dnde y cmo usarlos. Las farmacias generalmente imprimen las instrucciones del medicamento slo en las cajas y no directamente en los tubos del [place].   Si su medicamento es muy caro, por favor, pngase en contacto con Rolm Gala llamando al [phone] y presione la opcin 4 o envenos un mensaje a travs de Clinical cytogeneticist.   No podemos decirle cul ser su copago por los medicamentos por adelantado ya que esto es  diferente dependiendo de la cobertura de su seguro. Sin embargo, es posible que podamos encontrar un medicamento sustituto a Audiological scientist un formulario para que el seguro cubra el medicamento que se considera necesario.   Si se requiere una autorizacin previa para que su compaa de seguros [place] su medicamento, por favor permtanos de 1 a 2 das hbiles para completar [address]  Los precios de los medicamentos varan con frecuencia dependiendo del Environmental consultant de dnde se surte la receta y alguna farmacias pueden ofrecer precios ms baratos.  El sitio web www.goodrx.com tiene cupones para medicamentos de Health and safety inspector. Los precios aqu no tienen en cuenta lo que podra costar con la ayuda del seguro (puede ser ms barato con su seguro), pero el sitio web puede darle el precio si no utiliz Tourist information centre manager.  - Puede imprimir el cupn correspondiente y llevarlo con su receta a la farmacia.  - Tambin puede pasar por nuestra oficina durante el horario de atencin regular y Education officer, museum una tarjeta de cupones de GoodRx.  - Si necesita que su receta se enve electrnicamente a una farmacia diferente, informe a nuestra oficina a travs de MyChart de [place] o por telfono llamando al [phone] y presione la opcin 4.

## 2022-07-10 NOTE — Progress Notes (Signed)
   Follow-Up Visit   Subjective  Lisa Pope is a 76 y.o. female who presents for the following: Vitiligo (Face. 7 month recheck. Used sample of Opzelura, did not get Rx was $2500. Has been using Ketoconazole she was given prior. She would like to know if there is anything new she can try).  She has used tacrolimus in the past which didn't really help.  She feels like the Opzelura worked better.    The following portions of the chart were reviewed this encounter and updated as appropriate:      Review of Systems: No other skin or systemic complaints except as noted in HPI or Assessment and Plan.   Objective  Well appearing patient in no apparent distress; mood and affect are within normal limits.  A focused examination was performed including head, including the scalp, face, neck, nose, ears, eyelids, and lips. Relevant physical exam findings are noted in the Assessment and Plan.  face B/L medial canthus with depigmented macules, indistinct perioral hypopigmentation adjacent to vermilion edge. Depigmented patches at B/L dorsal hands R>L   Assessment & Plan  Vitiligo face  Chronic and persistent condition with duration or expected duration over one year. Condition is symptomatic / bothersome to patient. Not to goal.   Advised Ketoconazole does not help with vitiligo. Is used to treat fungal ingections  Opzelura samples (x2) given today to be applied twice daily to affected areas Once out of Opzelura start Pimecrolimus cream.  Start Pimecrolimus cream once or twice daily to affected ares on face  Vitiligo is a chronic autoimmune condition which causes loss of skin pigment and is commonly seen on the face and may also involve areas of trauma like hands, elbows, knees, and ankles. There is no cure and it is difficult to treat.  Treatments include topical steroids and other topical anti-inflammatory ointments/creams and topical and oral Jak inhibitors.  Sometimes narrow band UV  light therapy or Xtrac laser is helpful, both of which require twice weekly treatments for at least 3-6 months.  Antioxidant vitamins, such as Vitamins A,C,E,D, Folic Acid and B12 may be added to enhance treatment.    pimecrolimus (ELIDEL) 1 % cream - face Apply topically 2 (two) times daily.  Related Medications Ruxolitinib Phosphate (OPZELURA) 1.5 % CREA Apply 1 application topically in the morning and at bedtime.   Seborrheic Keratoses. Lower legs  - Stuck-on, waxy, tan-brown papules and/or plaques  - Benign-appearing - Discussed benign etiology and prognosis. - Observe - Call for any changes   Return if symptoms worsen or fail to improve, for Vitiligo Follow Up.  I, Lawson Radar, CMA, am acting as scribe for Willeen Niece, MD.  Documentation: I have reviewed the above documentation for accuracy and completeness, and I agree with the above.  Willeen Niece MD

## 2023-04-29 ENCOUNTER — Encounter: Payer: Self-pay | Admitting: *Deleted

## 2023-04-29 ENCOUNTER — Ambulatory Visit
Admission: EM | Admit: 2023-04-29 | Discharge: 2023-04-29 | Disposition: A | Discharge status: Home / Self Care | Payer: Medicare Other

## 2023-04-29 DIAGNOSIS — S61412A Laceration without foreign body of left hand, initial encounter: Secondary | ICD-10-CM | POA: Diagnosis not present

## 2023-04-29 HISTORY — DX: Noninfective gastroenteritis and colitis, unspecified: K52.9

## 2023-04-29 HISTORY — DX: Pure hypercholesterolemia, unspecified: E78.00

## 2023-04-29 HISTORY — DX: Essential (primary) hypertension: I10

## 2023-04-29 MED ORDER — CEPHALEXIN 500 MG PO CAPS: 500.0000 mg | ORAL_CAPSULE | Freq: Three times a day (TID) | ORAL | 0 refills | Status: AC

## 2023-04-29 MED ORDER — TETANUS-DIPHTH-ACELL PERTUSSIS 5-2.5-18.5 LF-MCG/0.5 IM SUSY
0.5000 mL | PREFILLED_SYRINGE | Freq: Once | INTRAMUSCULAR | Status: AC
  Administered 2023-04-29: 0.5 mL via INTRAMUSCULAR

## 2023-04-29 NOTE — Discharge Instructions (Addendum)
-  Clean the area well with soap and water daily and change your bandage.  If your bandage gets wet then change it sooner. - May apply small amount of Neosporin to the wound. - Start the antibiotics to try to prevent infection. - You can take Tylenol or Motrin as needed for pain relief. - You need to be seen again if there are any signs of infection including increased redness, swelling, pustular drainage or fever. - This can take a while-a couple weeks to heal.

## 2023-04-29 NOTE — ED Triage Notes (Addendum)
Pt states she fell in her garden last night about 7PM and has a laceration on the palm of her left hand. TDAP unknown

## 2023-04-29 NOTE — ED Provider Notes (Signed)
[facility]    CSN: [id number] Arrival date & time: 04/29/23  1434      History   Chief Complaint Chief Complaint  Patient presents with   Laceration    HPI Lisa Pope is a 77 y.o. female presenting for laceration of the left palm since last night.  She states she was in her garden last night and fell and cut her hand on a rock.  She reports that she initially cleaned the wound and covered it with a bandage.  States she is unsure of her last tetanus immunization.  She denies any severe pain or bleeding.  She says it has been oozing.  No numbness, weakness or other complaints.  No other injuries.  HPI  Past Medical History:  Diagnosis Date   Colitis    Hypercholesteremia    Hypertension    Thyroid disease     There are no problems to display for this patient.   History reviewed. No pertinent surgical history.  OB History   No obstetric history on file.      Home Medications    Prior to Admission medications   Medication Sig Start Date End Date Taking? Authorizing Provider  ALPRAZolam Prudy Feeler) 0.5 MG tablet Take 0.5 mg by mouth at bedtime as needed.   Yes [provider]  budesonide (ENTOCORT EC) 3 MG 24 hr capsule Take by mouth. 10/04/22  Yes [provider]  calcium carbonate (OSCAL) 1500 (600 Ca) MG TABS tablet Take by mouth.   Yes [provider]  cephALEXin (KEFLEX) 500 MG capsule Take 1 capsule (500 mg total) by mouth 3 (three) times daily for 7 days. 04/29/23 05/06/23 Yes Shirlee Latch, PA-C  cyclobenzaprine (FLEXERIL) 10 MG tablet Take 10 mg by mouth 2 (two) times daily as needed.   Yes [provider]  escitalopram (LEXAPRO) 5 MG tablet Take by mouth. 01/13/23 01/13/24 Yes [provider]  hydrOXYzine (ATARAX) 50 MG tablet Take by mouth.   Yes [provider]  ibandronate (BONIVA) 150 MG tablet Take by mouth.   Yes [provider]  levothyroxine (SYNTHROID, LEVOTHROID) 88 MCG  tablet Take 88 mcg daily before breakfast by mouth.   Yes [provider]  losartan (COZAAR) 50 MG tablet Take 50 mg by mouth daily. 10/30/21  Yes [provider]  Multiple Vitamin (MULTIVITAMIN) tablet Take 1 tablet daily by mouth.   Yes [provider]  Omega-3 Fatty Acids (FISH OIL PO) Take daily by mouth.   Yes [provider]  pimecrolimus (ELIDEL) 1 % cream Apply topically 2 (two) times daily. 07/10/22  Yes Willeen Niece, MD  pravastatin (PRAVACHOL) 10 MG tablet Take by mouth. 12/29/21 03/19/24 Yes [provider]  Ruxolitinib Phosphate (OPZELURA) 1.5 % CREA Apply 1 application topically in the morning and at bedtime. 12/18/21  Yes Willeen Niece, MD  ketoconazole (NIZORAL) 2 % cream Apply topically daily. Qd to chest and neck Patient not taking: Reported on 12/18/2021 01/01/21   Willeen Niece, MD    Family History Family History  Problem Relation Age of Onset   Cancer Mother    Heart attack Father     Social History Social History   Tobacco Use   Smoking status: Former   Smokeless tobacco: Never  Building services engineer Use: Never used  Substance Use Topics   Alcohol use: Yes   Drug use: No     Allergies   Amoxicillin and Rosuvastatin   Review of Systems  Review of Systems  Musculoskeletal:  Negative for arthralgias and joint swelling.  Skin:  Positive for wound. Negative for color change.  Neurological:  Negative for weakness and numbness.     Physical Exam Triage Vital Signs ED Triage Vitals  Enc Vitals Group     BP      Pulse      Resp      Temp      Temp src      SpO2      Weight      Height      Head Circumference      Peak Flow      Pain Score      Pain Loc      Pain Edu?      Excl. in GC?    No data found.  Updated Vital Signs BP (!) 141/98 (BP Location: Right Arm)   Pulse 68   Temp 98.1 F (36.7 C) (Oral)   Resp 18   SpO2 99%      Physical Exam Vitals and nursing note reviewed.  Constitutional:       General: She is not in acute distress.    Appearance: Normal appearance. She is not ill-appearing or toxic-appearing.  HENT:     Head: Normocephalic and atraumatic.  Eyes:     General: No scleral icterus.       Right eye: No discharge.        Left eye: No discharge.     Conjunctiva/sclera: Conjunctivae normal.  Cardiovascular:     Rate and Rhythm: Normal rate and regular rhythm.     Pulses: Normal pulses.  Pulmonary:     Effort: Pulmonary effort is normal. No respiratory distress.  Musculoskeletal:     Cervical back: Neck supple.  Skin:    General: Skin is dry.     Findings: Wound (jagged 1.5 cm laceration of left palm with contamination (dirt). No bleeding, Mild tenderness) present.  Neurological:     General: No focal deficit present.     Mental Status: She is alert. Mental status is at baseline.     Motor: No weakness.     Gait: Gait normal.  Psychiatric:        Mood and Affect: Mood normal.        Behavior: Behavior normal.        Thought Content: Thought content normal.      UC Treatments / Results  Labs (all labs ordered are listed, but only abnormal results are displayed) Labs Reviewed - No data to display  EKG   Radiology No results found.  Procedures Procedures (including critical care time)  Medications Ordered in UC Medications  Tdap (BOOSTRIX) injection 0.5 mL (0.5 mLs Intramuscular Given 04/29/23 1701)    Initial Impression / Assessment and Plan / UC Course  I have reviewed the triage vital signs and the nursing notes.  Pertinent labs & imaging results that were available during my care of the patient were reviewed by me and considered in my medical decision making (see chart for details).   77 year old female presents for open wound of the left palm since yesterday after cutting hand on a rock in her garden.  Has cleaned the wound.  Unsure of last tetanus immunization.  On exam she has a jagged contaminated laceration that is approximately 1.5  cm of the palm.  Thoroughly flushed the wound with saline and chlorhexidine.  Applied 2 Steri-Strips, nonadherent pad and Coban.  Will prophylactically treat  with Keflex to try to prevent infection since this wound is nearly 24 hours old and I do not want to suture it given the time that it has been open and the fact that it was contaminated.  Discussed wound care guidelines with patient.  Advised her to return if any signs of infection.  Discussed use of Tylenol Motrin as needed for pain relief.   Final Clinical Impressions(s) / UC Diagnoses   Final diagnoses:  Laceration of left hand without foreign body, initial encounter     Discharge Instructions      -Clean the area well with soap and water daily and change your bandage.  If your bandage gets wet then change it sooner. - May apply small amount of Neosporin to the wound. - Start the antibiotics to try to prevent infection. - You can take Tylenol or Motrin as needed for pain relief. - You need to be seen again if there are any signs of infection including increased redness, swelling, pustular drainage or fever. - This can take a while-a couple weeks to heal.     ED Prescriptions     Medication Sig Dispense Auth. Provider   cephALEXin (KEFLEX) 500 MG capsule Take 1 capsule (500 mg total) by mouth 3 (three) times daily for 7 days. 21 capsule Shirlee Latch, PA-C      PDMP not reviewed this encounter.   Shirlee Latch, PA-C 04/29/23 1930

## 2023-10-01 ENCOUNTER — Ambulatory Visit: Payer: Medicare Other | Admitting: Dermatology
